# Patient Record
Sex: Male | Born: 2000 | Race: Black or African American | Hispanic: No | Marital: Single | Smoking: Never smoker
Health system: Southern US, Community
[De-identification: ages and names within clinical notes are randomized; demographics above are authoritative.]

## PROBLEM LIST (undated history)

## (undated) ENCOUNTER — Emergency Department: Admission: EM | Payer: Managed Care, Other (non HMO)

---
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
## 2020-01-05 IMAGING — DX RIGHT ANKLE - COMPLETE 3+ VIEW
3 series · 3 of 3 positions shown · non-contrast
Comparison: None.

CLINICAL DATA: Right ankle pain after fall.

EXAM:
RIGHT ANKLE - COMPLETE 3+ VIEW

[ankle ap]
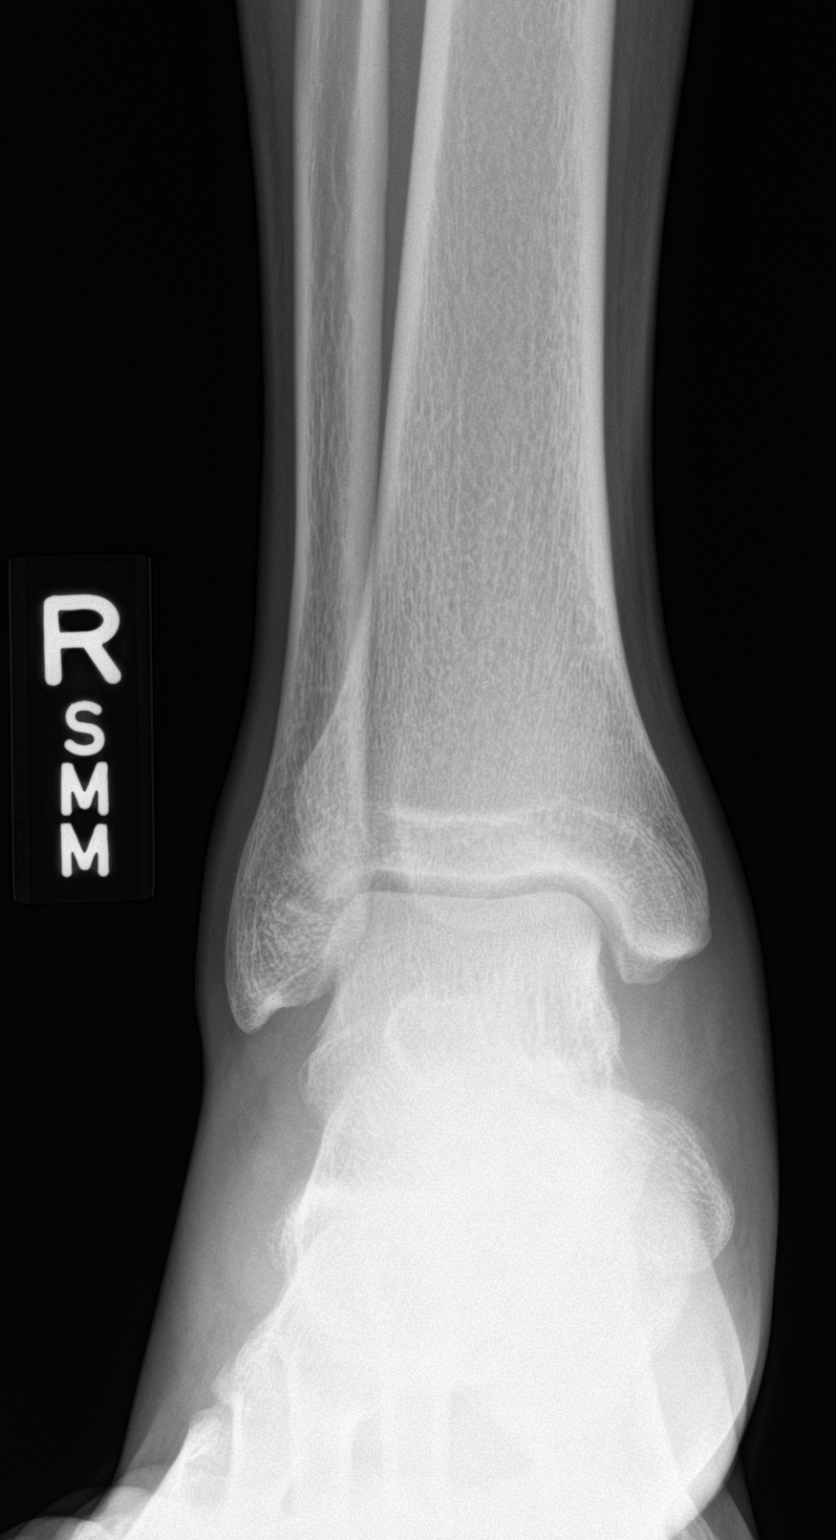

[ankle obl]
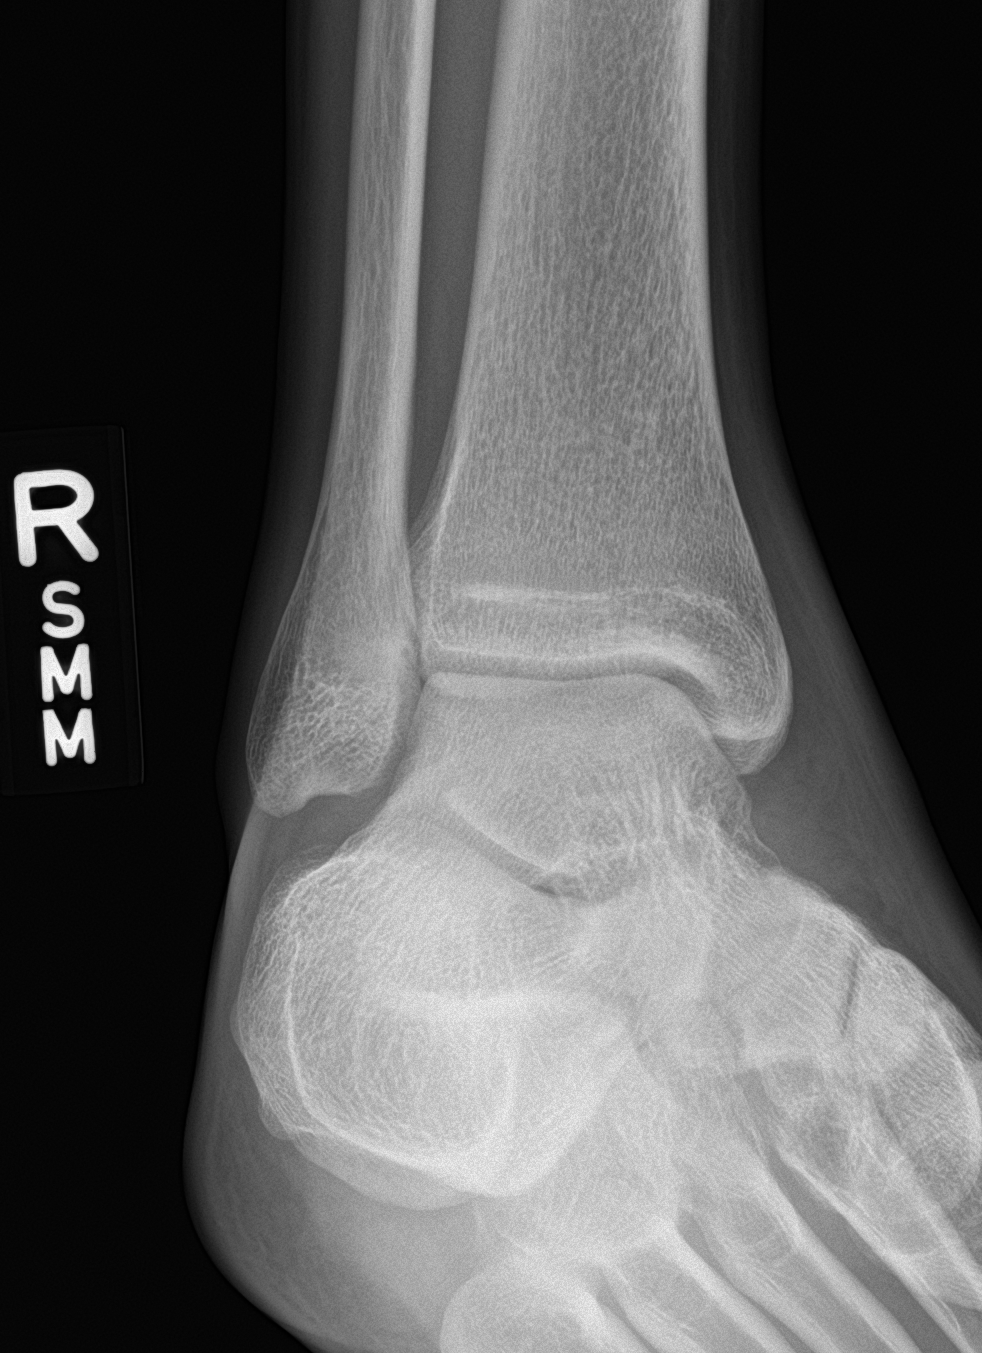

[ankle lat]
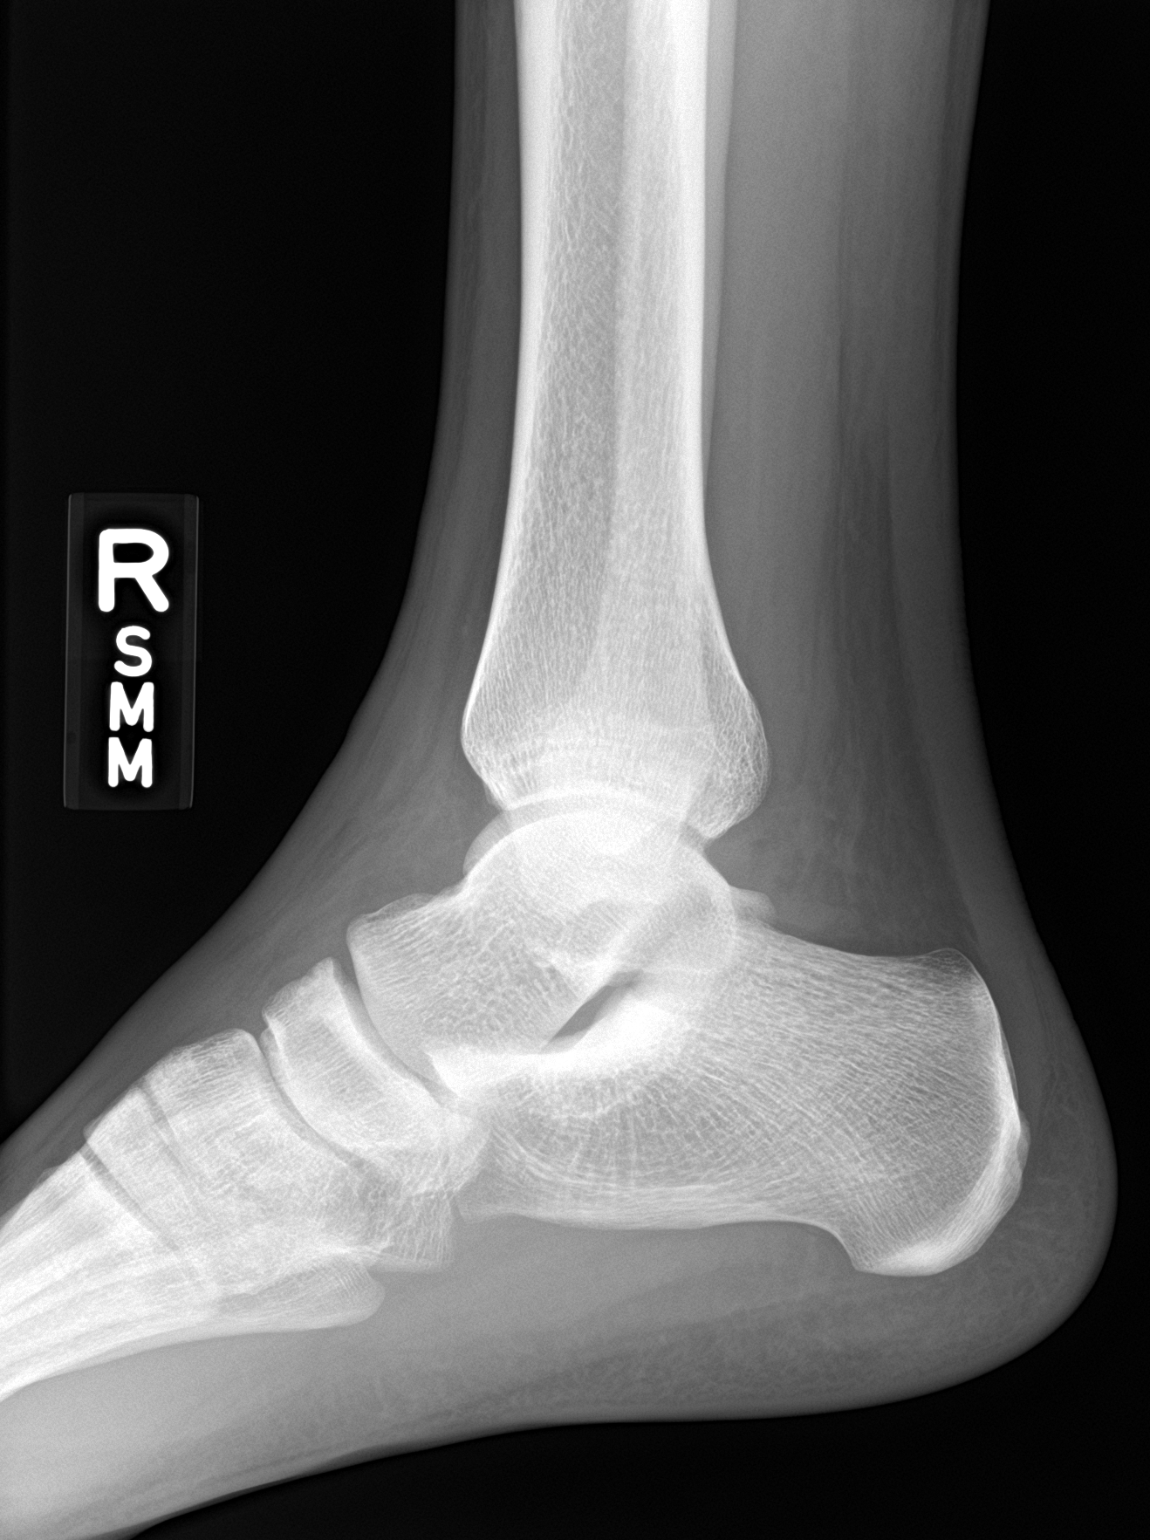

[3 of 3 positions shown; findings below may reference images not displayed]

FINDINGS: There is no evidence of fracture, dislocation, or joint effusion.
There is no evidence of arthropathy or other focal bone abnormality.
Soft tissues are unremarkable.
IMPRESSION: Negative.
